# Patient Record
Sex: Female | Born: 1990 | Hispanic: Yes | Marital: Single | Smoking: Never smoker
Health system: Southern US, Community
[De-identification: ages and names within clinical notes are randomized; demographics above are authoritative.]

## PROBLEM LIST (undated history)

## (undated) DIAGNOSIS — G43909 Migraine, unspecified, not intractable, without status migrainosus: Secondary | ICD-10-CM

---
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
## 2015-05-27 ENCOUNTER — Encounter: Payer: Self-pay | Admitting: Emergency Medicine

## 2015-05-27 ENCOUNTER — Emergency Department
Admission: EM | Admit: 2015-05-27 | Discharge: 2015-05-27 | Disposition: A | Discharge status: Home / Self Care | Payer: Self-pay | Attending: Emergency Medicine | Admitting: Emergency Medicine

## 2015-05-27 DIAGNOSIS — Z3202 Encounter for pregnancy test, result negative: Secondary | ICD-10-CM | POA: Insufficient documentation

## 2015-05-27 DIAGNOSIS — G44219 Episodic tension-type headache, not intractable: Secondary | ICD-10-CM | POA: Insufficient documentation

## 2015-05-27 HISTORY — DX: Migraine, unspecified, not intractable, without status migrainosus: G43.909

## 2015-05-27 LAB — POCT PREGNANCY, URINE: PREG TEST UR: NEGATIVE

## 2015-05-27 MED ORDER — METOCLOPRAMIDE HCL 5 MG/ML IJ SOLN
10.0000 mg | Freq: Once | INTRAMUSCULAR | Status: DC
  Filled 2015-05-27: qty 2

## 2015-05-27 MED ORDER — IBUPROFEN 800 MG PO TABS: 800.0000 mg | ORAL_TABLET | Freq: Three times a day (TID) | ORAL | Status: AC | PRN

## 2015-05-27 MED ORDER — KETOROLAC TROMETHAMINE 30 MG/ML IJ SOLN
30.0000 mg | Freq: Once | INTRAMUSCULAR | Status: DC
Start: 2015-05-27 — End: 2015-05-28
  Filled 2015-05-27: qty 1

## 2015-05-27 MED ORDER — METOCLOPRAMIDE HCL 10 MG PO TABS: 10.0000 mg | ORAL_TABLET | Freq: Three times a day (TID) | ORAL | Status: AC | PRN

## 2015-05-27 MED ORDER — SODIUM CHLORIDE 0.9 % IV BOLUS (SEPSIS): 1000.0000 mL | Freq: Once | INTRAVENOUS | Status: DC

## 2015-05-27 NOTE — ED Notes (Signed)
Was going to start an IV on the pt to administer medication. Pt states she does not want the IV or any medication to be given through a needle. Asked pt if there was something specific she was wanting [place] to do and she states she wants to go home. MD aware and discharge instructions prepared. Pt and family standing in hallway waiting on paperwork.

## 2015-05-27 NOTE — Discharge Instructions (Signed)
Tension Headache A tension headache is pain, pressure, or aching that is felt over the front and sides of your head. These headaches can last from 30 minutes to several days. HOME CARE Managing Pain  Take over-the-counter and prescription medicines only as told by your doctor.  Lie down in a dark, quiet room when you have a headache.  If directed, apply ice to your head and neck area:  Put ice in a plastic bag.  Place a towel between your skin and the bag.  Leave the ice on for 20 minutes, 2-3 times per day.  Use a heating pad or a hot shower to apply heat to your head and neck area as told by your doctor. Eating and Drinking  Eat meals on a regular schedule.  Do not drink a lot of alcohol.  Do not use a lot of caffeine, or stop using caffeine. General Instructions  Keep all follow-up visits as told by your doctor. This is important.  Keep a journal to find out if certain things bring on headaches. For example, write down:  What you eat and drink.  How much sleep you get.  Any change to your diet or medicines.  Try getting a massage, or doing other things that help you to relax.  Lessen stress.  Sit up straight. Do not tighten (tense) your muscles.  Do not use tobacco products. This includes cigarettes, chewing tobacco, or e-cigarettes. If you need help quitting, ask your doctor.  Exercise regularly as told by your doctor.  Get enough sleep. This may mean 7-9 hours of sleep. GET HELP IF:  Your symptoms are not helped by medicine.  You have a headache that feels different from your usual headache.  You feel sick to your stomach (nauseous) or you throw up (vomit).  You have a fever. GET HELP RIGHT AWAY IF:  Your headache becomes very bad.  You keep throwing up.  You have a stiff neck.  You have trouble seeing.  You have trouble speaking.  You have pain in your eye or ear.  Your muscles are weak or you lose muscle control.  You lose your balance  or you have trouble walking.  You feel like you will pass out (faint) or you pass out.  You have confusion.   This information is not intended to replace advice given to you by your health care provider. Make sure you discuss any questions you have with your health care provider.   Document Released: 09/18/2009 Document Revised: 03/15/2015 Document Reviewed: 10/17/2014 Elsevier Interactive Patient Education 2016 [place] Headache Without Cause A headache is pain or discomfort felt around the head or neck area. The specific cause of a headache may not be found. There are many causes and types of headaches. A few common ones are:  Tension headaches.  Migraine headaches.  Cluster headaches.  Chronic daily headaches. HOME CARE INSTRUCTIONS  Watch your condition for any changes. Take these steps to help with your condition: Managing Pain  Take over-the-counter and prescription medicines only as told by your health care provider.  Lie down in a dark, quiet room when you have a headache.  If directed, apply ice to the head and neck area:  Put ice in a plastic bag.  Place a towel between your skin and the bag.  Leave the ice on for 20 minutes, 2-3 times per day.  Use a heating pad or hot shower to apply heat to the head and neck area as  told by your health care provider.  Keep lights dim if bright lights bother you or make your headaches worse. Eating and Drinking  Eat meals on a regular schedule.  Limit alcohol use.  Decrease the amount of caffeine you drink, or stop drinking caffeine. General Instructions  Keep all follow-up visits as told by your health care provider. This is important.  Keep a headache journal to help find out what may trigger your headaches. For example, write down:  What you eat and drink.  How much sleep you get.  Any change to your diet or medicines.  Try massage or other relaxation techniques.  Limit stress.  Sit up  straight, and do not tense your muscles.  Do not use tobacco products, including cigarettes, chewing tobacco, or e-cigarettes. If you need help quitting, ask your health care provider.  Exercise regularly as told by your health care provider.  Sleep on a regular schedule. Get 7-9 hours of sleep, or the amount recommended by your health care provider. SEEK MEDICAL CARE IF:   Your symptoms are not helped by medicine.  You have a headache that is different from the usual headache.  You have nausea or you vomit.  You have a fever. SEEK IMMEDIATE MEDICAL CARE IF:   Your headache becomes severe.  You have repeated vomiting.  You have a stiff neck.  You have a loss of vision.  You have problems with speech.  You have pain in the eye or ear.  You have muscular weakness or loss of muscle control.  You lose your balance or have trouble walking.  You feel faint or pass out.  You have confusion.   This information is not intended to replace advice given to you by your health care provider. Make sure you discuss any questions you have with your health care provider.   Document Released: 06/24/2005 Document Revised: 03/15/2015 Document Reviewed: 10/17/2014 Elsevier Interactive Patient Education [employer].

## 2015-05-27 NOTE — ED Notes (Signed)
Headache, that started this morning.  Has hx of migraines over the past year.  This migraine is strong per patient but not unlike previous migraines in the past.  Also states she has noticed a lump on the side of her head last Tuesday but denies recent injury.  I see no lump or breaks in the skin.

## 2015-05-28 NOTE — ED Provider Notes (Signed)
Time Seen: Approximately 2100  I have reviewed the triage notes  Chief Complaint: Headache   History of Present Illness: Shelby Hale is a 24 y.o. female who presents with left-sided cephalgia. Patient states that she's had the pain now for the past at 12 hours. She states she also noticed a "" a lot of "" on the right side of her head last Tuesday. She denies any trauma. Patient denies any fever, chills, persistent vomiting, photophobia etc. She states she has a history of migraines and her last headache was approximately 2 months ago. She denies any focal weakness or extremity symptoms. She denies any ataxia Past Medical History  Diagnosis Date  . Migraines     There are no active problems to display for this patient.   History reviewed. No pertinent past surgical history.  History reviewed. No pertinent past surgical history.  Current Outpatient Rx  Name  Route  Sig  Dispense  Refill  . ibuprofen (ADVIL,MOTRIN) 800 MG tablet   Oral   Take 1 tablet (800 mg total) by mouth every 8 (eight) hours as needed.   30 tablet   0   . metoCLOPramide (REGLAN) 10 MG tablet   Oral   Take 1 tablet (10 mg total) by mouth every 8 (eight) hours as needed for nausea.   30 tablet   1     Allergies:  Review of patient's allergies indicates no known allergies.  Family History: No family history on file.  Social History: Social History  Substance Use Topics  . Smoking status: Never Smoker   . Smokeless tobacco: None  . Alcohol Use: No     Review of Systems:   10 point review of systems was performed and was otherwise negative:  Constitutional: No fever Eyes: No visual disturbances ENT: No sore throat, ear pain Cardiac: No chest pain Respiratory: No shortness of breath, wheezing, or stridor Abdomen: No abdominal pain, no vomiting, No diarrhea Endocrine: No weight loss, No night sweats Extremities: No peripheral edema, cyanosis Skin: No rashes, easy bruising Neurologic: No  focal weakness, trouble with speech or swollowing Urologic: No dysuria, Hematuria, or urinary frequency   Physical Exam:  ED Triage Vitals  Enc Vitals Group     BP 05/27/15 2006 131/85 mmHg     Pulse Rate 05/27/15 2006 76     Resp 05/27/15 2006 18     Temp 05/27/15 2006 98.5 F (36.9 C)     Temp Source 05/27/15 2006 Oral     SpO2 05/27/15 2006 100 %     Weight 05/27/15 2000 150 lb (68.04 kg)     Height 05/27/15 2000 5\' 2"  (1.575 m)     Head Cir --      Peak Flow --      Pain Score 05/27/15 2000 8     Pain Loc --      Pain Edu? --      Excl. in [place]? --     General: Awake , Alert , and Oriented times 3; GCS 15 Head: Normal cephalic , atraumatic. There is no palpable masses on the right side or the patient states she felt warm. Her headache is reproducible with palpation p across the left temporal and parietal area. Eyes: Pupils equal , round, reactive to light Nose/Throat: No nasal drainage, patent upper airway without erythema or exudate.  Neck: Supple, Full range of motion, No anterior adenopathy or palpable thyroid masses Lungs: Clear to ascultation without wheezes , rhonchi, or rales  Heart: Regular rate, regular rhythm without murmurs , gallops , or rubs Abdomen: Soft, non tender without rebound, guarding , or rigidity; bowel sounds positive and symmetric in all 4 quadrants. No organomegaly .        Extremities: 2 plus symmetric pulses. No edema, clubbing or cyanosis Neurologic: normal ambulation, Motor symmetric without deficits, sensory intact Skin: warm, dry, no rashes   Labs:   All laboratory work was reviewed including any pertinent negatives or positives listed below:  Labs Reviewed  POCT PREGNANCY, URINE      ED Course: Differential diagnosis includes but is not exclusive to subarachnoid hemorrhage, meningitis, encephalitis, previous head trauma, cavernous venous thrombosis, muscle tension headache, temporal arteritis, migraine or migraine equivalent, etc. I  felt given her current clinical presentation and objective findings this most likely was a muscle tension headache the patient declined on any IV medications and will be discharged on ibuprofen and Reglan. She does have a history of migraines this headache seemed to be reproducible.    Assessment: Acute exacerbation of chronic cephalgia  Final Clinical Impression:  Final diagnoses:  Episodic tension-type headache, not intractable     Plan: * Outpatient management Patient was advised to return immediately if condition worsens. Patient was advised to follow up with her primary care physician or other specialized physicians involved and in their current assessment.            Daymon Larsen, MD 05/28/15 [phone]

## 2017-09-23 ENCOUNTER — Other Ambulatory Visit: Payer: Self-pay | Admitting: Physician Assistant

## 2017-09-23 ENCOUNTER — Other Ambulatory Visit: Payer: Self-pay | Admitting: *Deleted

## 2017-09-23 DIAGNOSIS — Z3401 Encounter for supervision of normal first pregnancy, first trimester: Secondary | ICD-10-CM

## 2017-09-23 DIAGNOSIS — Z3687 Encounter for antenatal screening for uncertain dates: Secondary | ICD-10-CM

## 2017-09-26 ENCOUNTER — Ambulatory Visit
Admission: RE | Admit: 2017-09-26 | Discharge: 2017-09-26 | Disposition: A | Discharge status: Home / Self Care | Payer: Self-pay | Source: Ambulatory Visit | Attending: Physician Assistant | Admitting: Physician Assistant

## 2017-09-26 ENCOUNTER — Encounter: Payer: Self-pay | Admitting: Radiology

## 2017-09-26 DIAGNOSIS — Z3401 Encounter for supervision of normal first pregnancy, first trimester: Secondary | ICD-10-CM | POA: Insufficient documentation

## 2017-09-26 DIAGNOSIS — Z3A09 9 weeks gestation of pregnancy: Secondary | ICD-10-CM | POA: Insufficient documentation

## 2017-09-26 DIAGNOSIS — Z3687 Encounter for antenatal screening for uncertain dates: Secondary | ICD-10-CM | POA: Insufficient documentation

## 2017-11-19 ENCOUNTER — Other Ambulatory Visit: Payer: Self-pay | Admitting: Physician Assistant

## 2017-11-19 DIAGNOSIS — Z3402 Encounter for supervision of normal first pregnancy, second trimester: Secondary | ICD-10-CM

## 2017-11-19 DIAGNOSIS — Z20821 Contact with and (suspected) exposure to Zika virus: Secondary | ICD-10-CM

## 2017-11-27 ENCOUNTER — Encounter: Payer: Self-pay | Admitting: *Deleted

## 2017-11-27 ENCOUNTER — Ambulatory Visit
Admission: RE | Admit: 2017-11-27 | Discharge: 2017-11-27 | Disposition: A | Discharge status: Home / Self Care | Payer: Medicaid Other | Source: Ambulatory Visit | Attending: Maternal & Fetal Medicine | Admitting: Maternal & Fetal Medicine

## 2017-11-27 DIAGNOSIS — Z3A18 18 weeks gestation of pregnancy: Secondary | ICD-10-CM | POA: Insufficient documentation

## 2017-11-27 DIAGNOSIS — Z20821 Contact with and (suspected) exposure to Zika virus: Secondary | ICD-10-CM | POA: Diagnosis present

## 2017-11-27 DIAGNOSIS — Z3402 Encounter for supervision of normal first pregnancy, second trimester: Secondary | ICD-10-CM

## 2018-07-20 ENCOUNTER — Encounter (HOSPITAL_COMMUNITY): Payer: Self-pay

## 2018-08-28 ENCOUNTER — Other Ambulatory Visit: Payer: Self-pay | Admitting: Physician Assistant

## 2018-08-28 DIAGNOSIS — Z3687 Encounter for antenatal screening for uncertain dates: Secondary | ICD-10-CM

## 2018-09-02 ENCOUNTER — Ambulatory Visit
Admission: RE | Admit: 2018-09-02 | Discharge: 2018-09-02 | Disposition: A | Discharge status: Home / Self Care | Payer: Medicaid Other | Source: Ambulatory Visit | Attending: Physician Assistant | Admitting: Physician Assistant

## 2018-09-02 DIAGNOSIS — Z3687 Encounter for antenatal screening for uncertain dates: Secondary | ICD-10-CM | POA: Diagnosis present

## 2018-09-11 IMAGING — US US OB < 14 WEEKS - US OB TV
1 series · 14 of 28 positions shown · non-contrast
Comparison: None.

CLINICAL DATA: Antenatal screening, uncertain dates

EXAM:
OBSTETRIC <14 WK US AND TRANSVAGINAL OB US
TECHNIQUE: Both transabdominal and transvaginal ultrasound examinations were
performed for complete evaluation of the gestation as well as the
maternal uterus, adnexal regions, and pelvic cul-de-sac.
Transvaginal technique was performed to assess early pregnancy.

[Series 1: us ob < 14 weeks - us ob tv · 0.16mm/px · 14 of 235 slices shown]
[im 9/235]
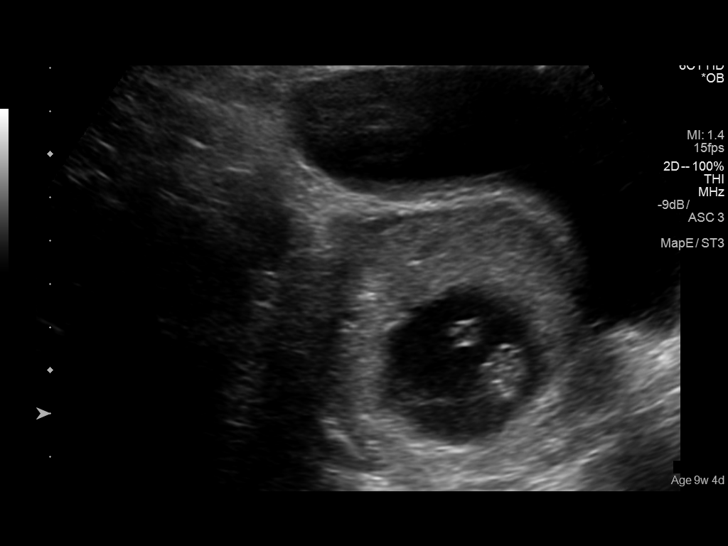
[im 27/235]
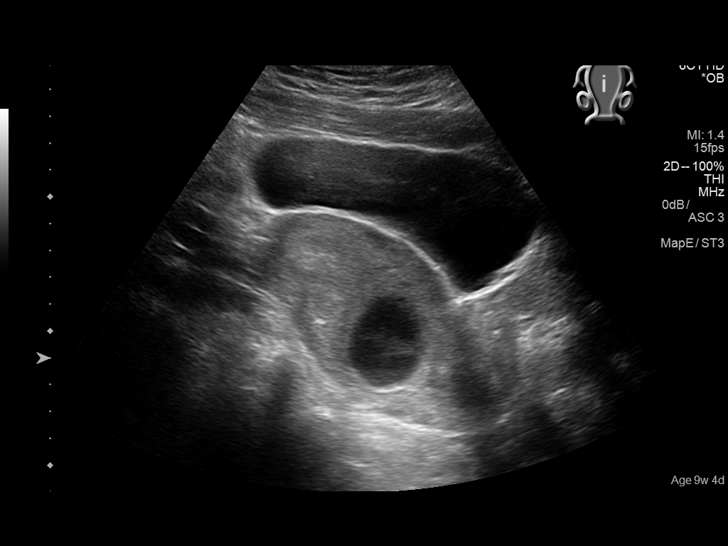
[im 44/235]
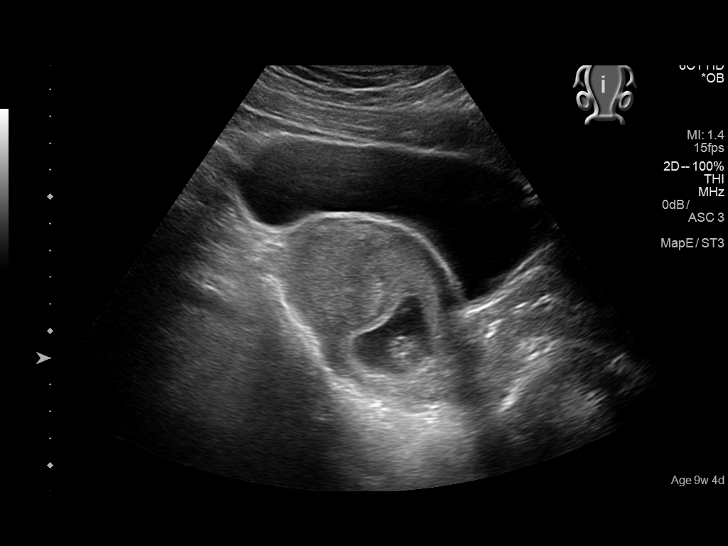
[im 61/235]
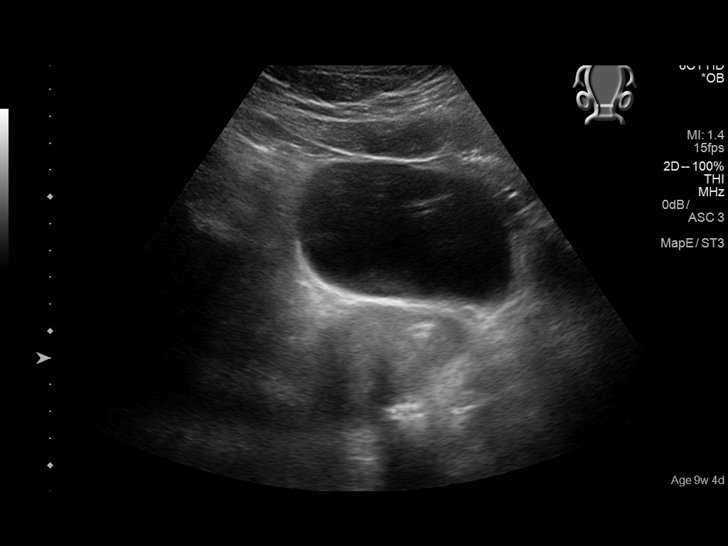
[im 79/235]
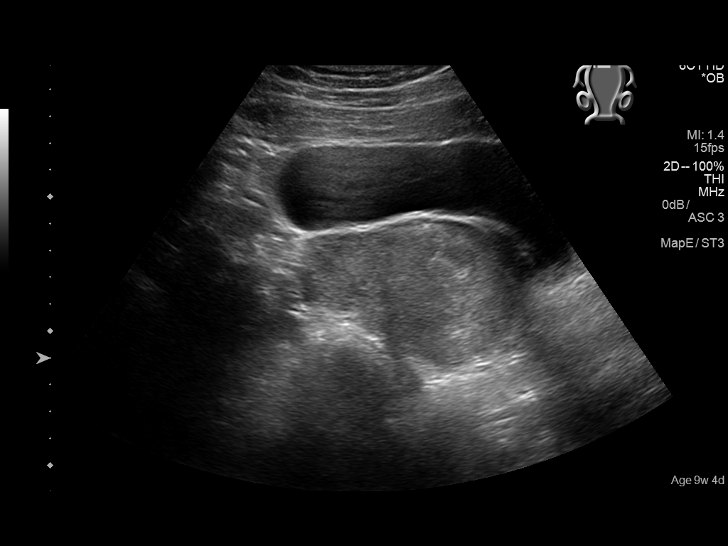
[im 96/235]
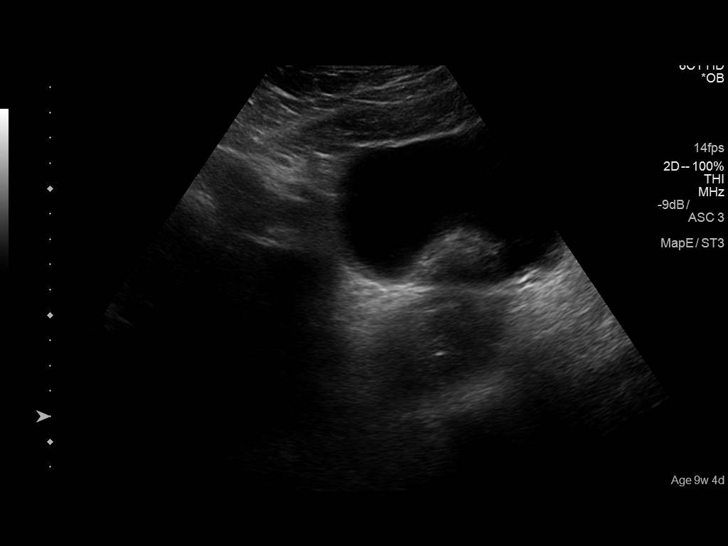
[im 113/235]
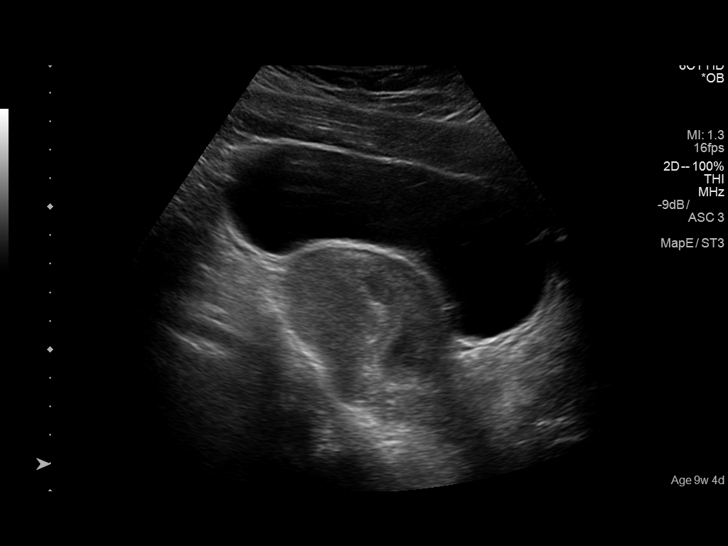
[im 131/235]
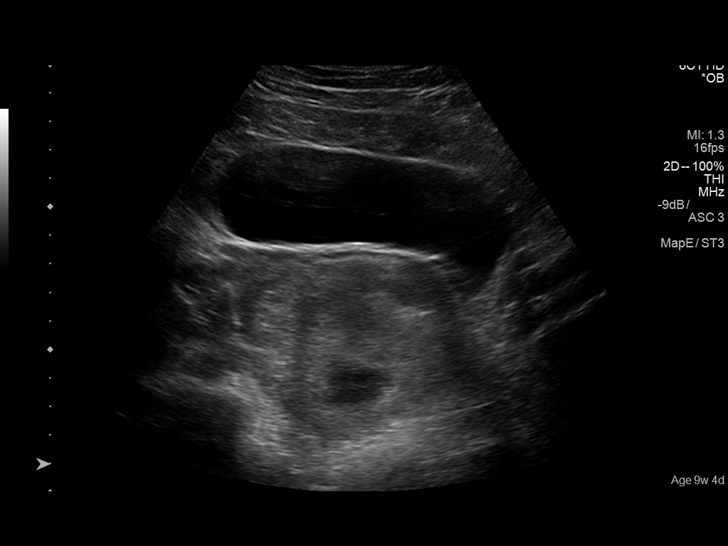
[im 148/235]
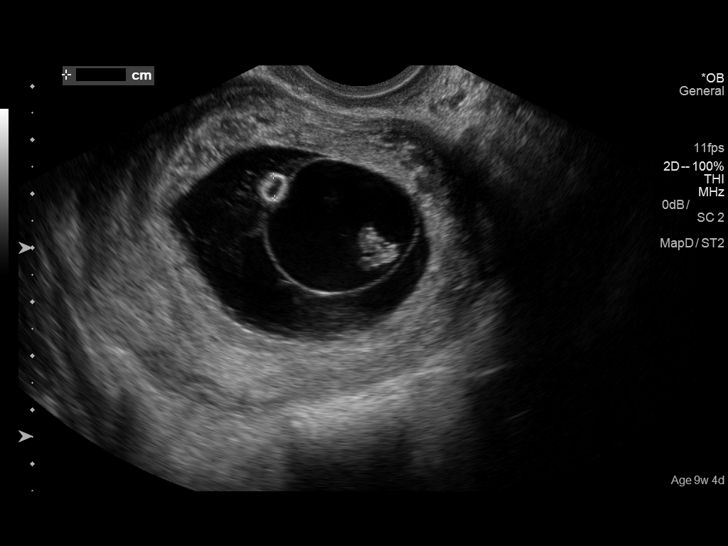
[im 165/235]
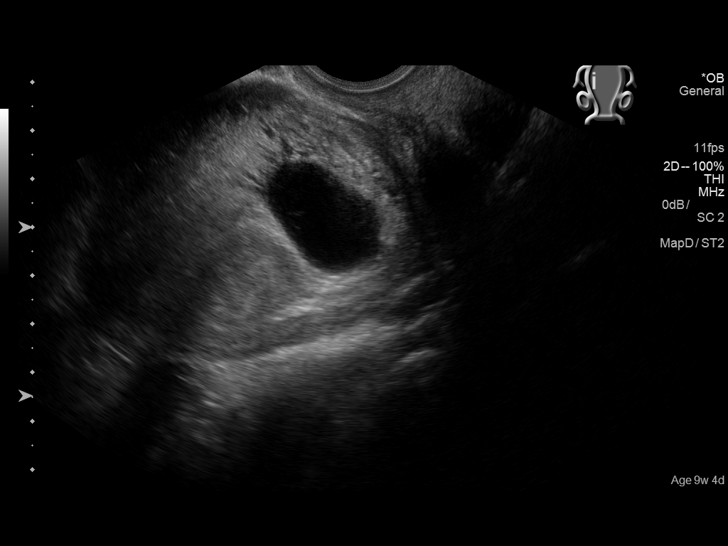
[im 183/235]
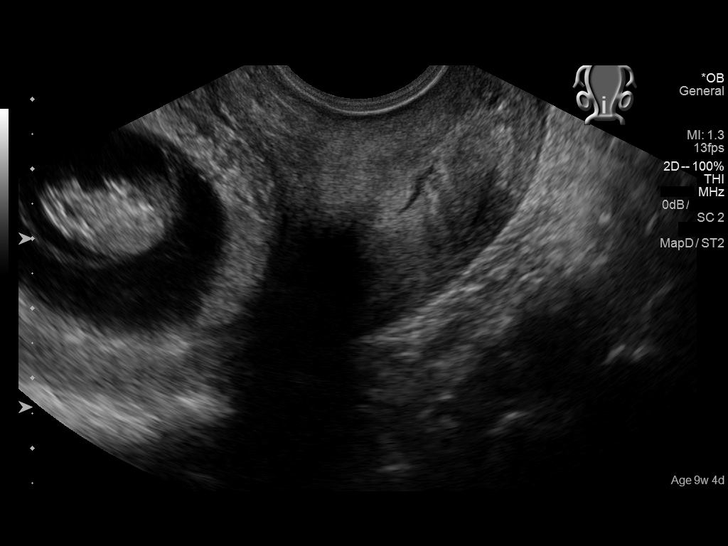
[im 200/235]
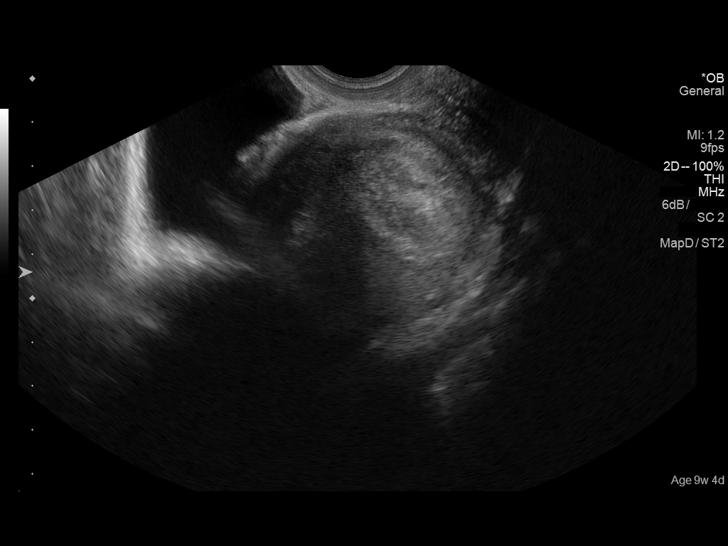
[im 217/235]
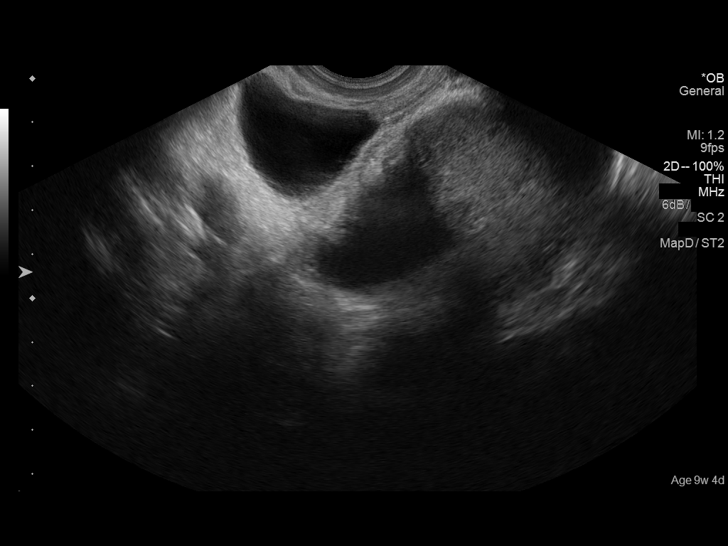
[im 235/235]
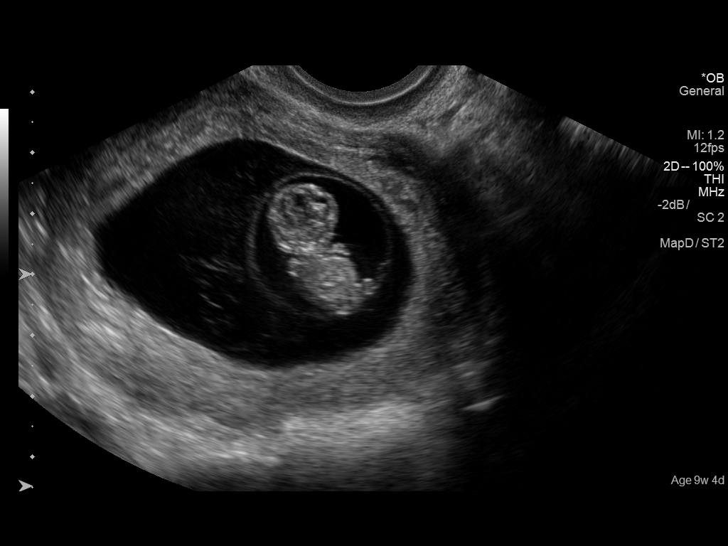

[14 of 28 positions shown; findings below may reference images not displayed]

FINDINGS: Intrauterine gestational sac: Visible

Yolk sac:  Visible

Embryo:  Visible

Cardiac Activity: Visible

Heart Rate: 162 bpm

CRL:  24.2 mm   9 w   1 d                  US EDC: 04/30/2018

Subchorionic hemorrhage:  None visualized.

Maternal uterus/adnexae: Right ovary is not visualized. Left ovary
within normal limits and measures 3.4 x 1.8 x 2.3 cm. No significant
free fluid.
IMPRESSION: Single viable intrauterine pregnancy as above. Nonvisualized right
ovary.

## 2020-08-18 ENCOUNTER — Other Ambulatory Visit: Payer: Self-pay

## 2020-08-18 ENCOUNTER — Encounter: Payer: Self-pay | Admitting: Emergency Medicine

## 2020-08-18 ENCOUNTER — Emergency Department
Admission: EM | Admit: 2020-08-18 | Discharge: 2020-08-18 | Disposition: A | Discharge status: Home / Self Care | Payer: Medicaid Other | Attending: Emergency Medicine | Admitting: Emergency Medicine

## 2020-08-18 DIAGNOSIS — L500 Allergic urticaria: Secondary | ICD-10-CM | POA: Insufficient documentation

## 2020-08-18 DIAGNOSIS — L509 Urticaria, unspecified: Secondary | ICD-10-CM | POA: Diagnosis present

## 2020-08-18 DIAGNOSIS — T7840XA Allergy, unspecified, initial encounter: Secondary | ICD-10-CM | POA: Diagnosis not present

## 2020-08-18 DIAGNOSIS — R0789 Other chest pain: Secondary | ICD-10-CM | POA: Insufficient documentation

## 2020-08-18 DIAGNOSIS — R1310 Dysphagia, unspecified: Secondary | ICD-10-CM | POA: Insufficient documentation

## 2020-08-18 MED ORDER — PREDNISONE 20 MG PO TABS
60.0000 mg | ORAL_TABLET | Freq: Once | ORAL | Status: AC
  Administered 2020-08-18: 60 mg via ORAL
  Filled 2020-08-18 (×2): qty 3

## 2020-08-18 MED ORDER — FAMOTIDINE IN NACL 20-0.9 MG/50ML-% IV SOLN
20.0000 mg | Freq: Once | INTRAVENOUS | Status: AC
Start: 2020-08-18 — End: 2020-08-18
  Administered 2020-08-18: 20 mg via INTRAVENOUS
  Filled 2020-08-18: qty 50

## 2020-08-18 MED ORDER — EPINEPHRINE 0.3 MG/0.3ML IJ SOAJ: 0.3000 mg | INTRAMUSCULAR | 1 refills | Status: AC | PRN

## 2020-08-18 MED ORDER — EPINEPHRINE 0.3 MG/0.3ML IJ SOAJ
0.3000 mg | Freq: Once | INTRAMUSCULAR | Status: AC
  Administered 2020-08-18: 0.3 mg via INTRAMUSCULAR
  Filled 2020-08-18: qty 0.3

## 2020-08-18 MED ORDER — PREDNISONE 20 MG PO TABS: 40.0000 mg | ORAL_TABLET | Freq: Every day | ORAL | 0 refills | Status: AC

## 2020-08-18 MED ORDER — DIPHENHYDRAMINE HCL 50 MG/ML IJ SOLN
12.5000 mg | Freq: Once | INTRAMUSCULAR | Status: AC
  Administered 2020-08-18: 12.5 mg via INTRAVENOUS
  Filled 2020-08-18: qty 1

## 2020-08-18 NOTE — ED Triage Notes (Signed)
Pt to ED via POV with c/o itching, pt states itching started last night to L elbow. Pt states progressed to arms and palms. Pt states ate peanuts last night with no known reaction to peanuts. Pt able to speak in full and complete sentences at this time.  Pt states awoke this morning and her eyes were swollen, pt with mild rash noted to bilat palms. Pt states feels like it is difficult to swallow, pt able to maintain her own secretions without difficulty. Pt states hx of this kind of reaction to pecans.   Pt states OTC allergy relief PTA.

## 2020-08-18 NOTE — ED Provider Notes (Signed)
[facility] Emergency Department Provider Note    Event Date/Time   First MD Initiated Contact with Patient 08/18/20 1506     (approximate)  I have reviewed the triage vital signs and the nursing notes.   HISTORY  Chief Complaint Allergic reaction  HPI Shelby Hale is a 30 y.o. female with no significant past medical history presents to the ER for evaluation of diffuse spreading urticarial rash associated with feelings of chest tightness and trouble swallowing.  States that she ate some peanuts last night.  Shortly thereafter she started developing the symptoms.  Is not had anaphylactic reaction before but has had urticaria and rash related to eating pecans previously.  No new deodorants or perfumes.   Past Medical History:  Diagnosis Date  . Migraines    History reviewed. No pertinent family history. History reviewed. No pertinent surgical history. There are no problems to display for this patient.     Prior to Admission medications   Medication Sig Start Date End Date Taking? Authorizing Provider  predniSONE (DELTASONE) 20 MG tablet Take 2 tablets (40 mg total) by mouth daily for 4 days. 08/18/20 08/22/20 Yes Merlyn Lot, MD  ibuprofen (ADVIL,MOTRIN) 800 MG tablet Take 1 tablet (800 mg total) by mouth every 8 (eight) hours as needed. Patient not taking: Reported on 11/27/2017 05/27/15   Daymon Larsen, MD  metoCLOPramide (REGLAN) 10 MG tablet Take 1 tablet (10 mg total) by mouth every 8 (eight) hours as needed for nausea. 05/27/15 05/26/16  Daymon Larsen, MD  Prenatal Vit-Fe Fumarate-FA (PRENATAL MULTIVITAMIN) TABS tablet Take 1 tablet by mouth daily at 12 noon.    [provider]    Allergies Patient has no known allergies.    Social History Social History   Tobacco Use  . Smoking status: Never Smoker  . Smokeless tobacco: Never Used  Substance Use Topics  . Alcohol use: No  . Drug use: No    Review of  Systems Patient denies headaches, rhinorrhea, blurry vision, numbness, shortness of breath, chest pain, edema, cough, abdominal pain, nausea, vomiting, diarrhea, dysuria, fevers, rashes or hallucinations unless otherwise stated above in HPI. ____________________________________________   PHYSICAL EXAM:  VITAL SIGNS: Vitals:   08/18/20 1454 08/18/20 1611  BP: 129/81 117/71  Pulse: 88 81  Resp: 20 18  Temp: 98.1 F (36.7 C)   SpO2: 97% 97%    Constitutional: Alert and oriented.  Eyes: Conjunctivae are normal.  Head: Atraumatic. Nose: No congestion/rhinnorhea. Mouth/Throat: Mucous membranes are moist.   Neck: No stridor. Painless ROM.  Cardiovascular: Normal rate, regular rhythm. Grossly normal heart sounds.  Good peripheral circulation. Respiratory: Normal respiratory effort.  No retractions. Lungs CTAB. Gastrointestinal: Soft and nontender. No distention. No abdominal bruits. No CVA tenderness. Genitourinary:  Musculoskeletal: No lower extremity tenderness nor edema.  No joint effusions. Neurologic:  Normal speech and language. No gross focal neurologic deficits are appreciated. No facial droop Skin:  Skin is warm, dry and intact. Diffuse urticarial erruption to face, neck, extremities Psychiatric: Mood and affect are normal. Speech and behavior are normal.  ____________________________________________   LABS (all labs ordered are listed, but only abnormal results are displayed)  No results found for this or any previous visit (from the past 24 hour(s)). ____________________________________________ ____________________________________________  [id number]  I personally reviewed all radiographic images ordered to evaluate for the above acute complaints and reviewed radiology reports and findings.  These findings were personally discussed with the patient.  Please see medical record  for radiology  report.  ____________________________________________   PROCEDURES  Procedure(s) performed:  Procedures    Critical Care performed: no ____________________________________________   INITIAL IMPRESSION / ASSESSMENT AND PLAN / ED COURSE  Pertinent labs & imaging results that were available during my care of the patient were reviewed by me and considered in my medical decision making (see chart for details).   DDX: Anaphylaxis, anaphylactoid reaction, urticaria, food allergy  Shelby Hale is a 30 y.o. who presents to the ED with presentation as described above.  Patient nontoxic-appearing but has urticarial irruption and feel like she is having trouble swallowing.  Will give epinephrine.  Will give steroid H1 and H2 observe and reassess.  Clinical Course as of 08/18/20 1758  Fri Aug 18, 2020  1754 Patient reassessed.  Feels well and in no acute distress.  Urticaria has significantly resolved.  Patient stable and appropriate for discharge home [PR]    Clinical Course User Index [PR] Merlyn Lot, MD    The patient was evaluated in Emergency Department today for the symptoms described in the history of present illness. He/she was evaluated in the context of the global COVID-19 pandemic, which necessitated consideration that the patient might be at risk for infection with the SARS-CoV-2 virus that causes COVID-19. Institutional protocols and algorithms that pertain to the evaluation of patients at risk for COVID-19 are in a state of rapid change based on information released by regulatory bodies including the CDC and federal and state organizations. These policies and algorithms were followed during the patient's care in the ED.  As part of my medical decision making, I reviewed the following data within the [place] notes reviewed and incorporated, Labs reviewed, notes from prior ED visits and [place] Controlled Substance  Database   ____________________________________________   FINAL CLINICAL IMPRESSION(S) / ED DIAGNOSES  Final diagnoses:  Allergic reaction, initial encounter      NEW MEDICATIONS STARTED DURING THIS VISIT:  New Prescriptions   PREDNISONE (DELTASONE) 20 MG TABLET    Take 2 tablets (40 mg total) by mouth daily for 4 days.     Note:  This document was prepared using Dragon voice recognition software and may include unintentional dictation errors.    Merlyn Lot, MD 08/18/20 1758

## 2020-08-18 NOTE — ED Notes (Signed)
Patient resting comfortably at this time with no signs of distress. Patient states that she "feels better" than PTA.

## 2020-10-11 ENCOUNTER — Encounter: Payer: Self-pay | Admitting: Dermatology

## 2020-10-11 ENCOUNTER — Other Ambulatory Visit: Payer: Self-pay

## 2020-10-11 ENCOUNTER — Ambulatory Visit: Payer: Medicaid Other | Admitting: Dermatology

## 2020-10-11 DIAGNOSIS — D229 Melanocytic nevi, unspecified: Secondary | ICD-10-CM

## 2020-10-11 DIAGNOSIS — D224 Melanocytic nevi of scalp and neck: Secondary | ICD-10-CM | POA: Diagnosis not present

## 2020-10-11 NOTE — Patient Instructions (Signed)

## 2020-10-11 NOTE — Progress Notes (Signed)
   Follow-Up Visit   Subjective  Shelby Hale is a 30 y.o. female who presents for the following: Birth mark (R post scalp, ~89yrs, has growing past 4 years, itchy, irritating).  New patient referral from [facility].  The following portions of the chart were reviewed this encounter and updated as appropriate:   Tobacco  Allergies  Meds  Problems  Med Hx  Surg Hx  Fam Hx     Review of Systems:  No other skin or systemic complaints except as noted in HPI or Assessment and Plan.  Objective  Well appearing patient in no apparent distress; mood and affect are within normal limits.  A focused examination was performed including scalp. Relevant physical exam findings are noted in the Assessment and Plan.  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  Discussed risk of recurrence with shave removal.    Return for 1-2 wks for shave removal.  I, Othelia Pulling, RMA, am acting as scribe for Sarina Ser, MD .  Documentation: I have reviewed the above documentation for accuracy and completeness, and I agree with the above.  Sarina Ser, MD

## 2020-10-18 ENCOUNTER — Other Ambulatory Visit: Payer: Self-pay | Admitting: Dermatology

## 2020-10-18 ENCOUNTER — Ambulatory Visit: Payer: Medicaid Other | Admitting: Dermatology

## 2020-10-18 ENCOUNTER — Encounter: Payer: Self-pay | Admitting: Dermatology

## 2020-10-18 ENCOUNTER — Other Ambulatory Visit: Payer: Self-pay

## 2020-10-18 DIAGNOSIS — D224 Melanocytic nevi of scalp and neck: Secondary | ICD-10-CM

## 2020-10-18 DIAGNOSIS — D492 Neoplasm of unspecified behavior of bone, soft tissue, and skin: Secondary | ICD-10-CM

## 2020-10-18 NOTE — Progress Notes (Signed)
   Follow-Up Visit   Subjective  Shelby Hale is a 30 y.o. female who presents for the following: Follow-up (Pt here to have mole removed on the Right scalp).  Husband with pt   The following portions of the chart were reviewed this encounter and updated as appropriate:   Tobacco  Allergies  Meds  Problems  Med Hx  Surg Hx  Fam Hx     Review of Systems:  No other skin or systemic complaints except as noted in HPI or Assessment and Plan.  Objective  Well appearing patient in no apparent distress; mood and affect are within normal limits.  A focused examination was performed including face,scalp . Relevant physical exam findings are noted in the Assessment and Plan.  Objective  R post auricular scalp: 1.5cm mamillated plaque   Assessment & Plan  Neoplasm of skin R post auricular scalp  Epidermal / dermal shaving  Lesion diameter (cm):  1.5 Informed consent: discussed and consent obtained   Timeout: patient name, date of birth, surgical site, and procedure verified   Procedure prep:  Patient was prepped and draped in usual sterile fashion Prep type:  Isopropyl alcohol Anesthesia: the lesion was anesthetized in a standard fashion   Anesthetic:  1% lidocaine w/ epinephrine 1-100,000 buffered w/ 8.4% NaHCO3 Hemostasis achieved with: pressure, aluminum chloride and electrodesiccation   Outcome: patient tolerated procedure well   Post-procedure details: sterile dressing applied and wound care instructions given   Dressing type: bandage and petrolatum    Specimen 1 - Surgical pathology Differential Diagnosis: R/O Nevus sebaceous of [place]ad[place]ohn vs other birthmark nevus  Check Margins: No 1.5cm mamillated plaque  Return if symptoms worsen or fail to improve.  IMarye Round, CMA, am acting as scribe for Sarina Ser, MD .  Documentation: I have reviewed the above documentation for accuracy and completeness, and I agree with the above.  Sarina Ser, MD

## 2020-10-18 NOTE — Patient Instructions (Signed)

## 2020-10-23 ENCOUNTER — Telehealth: Payer: Self-pay

## 2020-10-23 NOTE — Telephone Encounter (Signed)
-----   Message from Ralene Bathe, MD sent at 10/19/2020  6:18 PM EDT ----- Diagnosis Skin , right posterior auricular scalp MELANOCYTIC NEVUS, COMPOUND CONGENITAL TYPE  Benign birth mark type mole No further treatment needed

## 2020-10-23 NOTE — Telephone Encounter (Signed)
Patient informed of pathology results. She wanted to know if it would be OK to dye her hair, and I advised her to wait another week or two to give the biopsy site time to heal.

## 2020-11-30 ENCOUNTER — Ambulatory Visit (INDEPENDENT_AMBULATORY_CARE_PROVIDER_SITE_OTHER): Payer: Medicaid Other | Admitting: Dermatology

## 2020-11-30 ENCOUNTER — Other Ambulatory Visit: Payer: Self-pay

## 2020-11-30 DIAGNOSIS — D492 Neoplasm of unspecified behavior of bone, soft tissue, and skin: Secondary | ICD-10-CM

## 2020-11-30 DIAGNOSIS — L928 Other granulomatous disorders of the skin and subcutaneous tissue: Secondary | ICD-10-CM

## 2020-11-30 MED ORDER — MUPIROCIN 2 % EX OINT: TOPICAL_OINTMENT | CUTANEOUS | 0 refills | Status: AC

## 2020-11-30 NOTE — Patient Instructions (Addendum)

## 2020-11-30 NOTE — Progress Notes (Signed)
   Follow-Up Visit   Subjective  Shelby Hale is a 30 y.o. female who presents for the following: Follow-up (Recheck biopsy site on the right post auricular scalp, biopsy proven Congential nevus, pt report area not healing well ).  The following portions of the chart were reviewed this encounter and updated as appropriate:   Tobacco  Allergies  Meds  Problems  Med Hx  Surg Hx  Fam Hx     Review of Systems:  No other skin or systemic complaints except as noted in HPI or Assessment and Plan.  Objective  Well appearing patient in no apparent distress; mood and affect are within normal limits.  A focused examination was performed including face, scalp . Relevant physical exam findings are noted in the Assessment and Plan.  Objective  right post auricular scalp: /ulcer with cellulitis    Assessment & Plan  Neoplasm of skin -residual congenital nevus versus granulation tissue versus other right post auricular scalp  Epidermal / dermal shaving  Lesion diameter (cm):  0.8 Informed consent: discussed and consent obtained   Patient was prepped and draped in usual sterile fashion: area prepped with alcohol. Anesthesia: the lesion was anesthetized in a standard fashion   Anesthetic:  1% lidocaine w/ epinephrine 1-100,000 buffered w/ 8.4% NaHCO3 Instrument used: flexible razor blade   Hemostasis achieved with: pressure, aluminum chloride and electrodesiccation   Outcome: patient tolerated procedure well   Post-procedure details: wound care instructions given   Post-procedure details comment:  Ointment and small bandage applied  Specimen 1 - Surgical pathology Differential Diagnosis: R/O  Recurrent Congential nevus   Check Margins: No /ulcer with cellulitis  Granulation tissue  Cleansed with Puracyn, Debridement, Mupirocin ointment applied  Start Mupirocin ointment apply to scalp daily  Return in about 4 weeks (around 12/28/2020) for recheck .  IMarye Round, CMA,  am acting as scribe for Sarina Ser, MD .  Documentation: I have reviewed the above documentation for accuracy and completeness, and I agree with the above.  Sarina Ser, MD

## 2020-12-04 ENCOUNTER — Encounter: Payer: Self-pay | Admitting: Dermatology

## 2020-12-11 ENCOUNTER — Telehealth: Payer: Self-pay

## 2020-12-11 NOTE — Telephone Encounter (Signed)
Patient informed of pathology results 

## 2020-12-11 NOTE — Telephone Encounter (Signed)
-----   Message from Ralene Bathe, MD sent at 12/08/2020  6:02 PM EDT ----- Diagnosis Skin , right post auricular scalp INFLAMED GRANULATION TISSUE, SEE DESCRIPTION  Benign inflamed "healing tissue" Wound should heal now since this removed. This can happen to any wound, but more common on scalp. Continue wound care and return if further problems.

## 2020-12-28 ENCOUNTER — Ambulatory Visit: Payer: Medicaid Other | Admitting: Dermatology

## 2021-02-20 ENCOUNTER — Encounter: Payer: Self-pay | Admitting: Emergency Medicine

## 2021-02-20 ENCOUNTER — Other Ambulatory Visit: Payer: Self-pay

## 2021-02-20 ENCOUNTER — Emergency Department
Admission: EM | Admit: 2021-02-20 | Discharge: 2021-02-20 | Disposition: A | Discharge status: Home / Self Care | Payer: Medicaid Other | Attending: Emergency Medicine | Admitting: Emergency Medicine

## 2021-02-20 DIAGNOSIS — R102 Pelvic and perineal pain: Secondary | ICD-10-CM | POA: Insufficient documentation

## 2021-02-20 DIAGNOSIS — Z5321 Procedure and treatment not carried out due to patient leaving prior to being seen by health care provider: Secondary | ICD-10-CM | POA: Diagnosis not present

## 2021-02-20 NOTE — ED Notes (Signed)
Pt called to a room, no response. Pt cell phone called and pt reported she left and would follow up elsewhere

## 2021-02-20 NOTE — ED Triage Notes (Signed)
Pt reports pelvic pain intermittently for several months. Pt reports went to [facility] and was told to follow up with a specialist for a definitive diagnosis but she was told the ER could get her answers faster. Pt admits to being sexually active and not using protection but states she has had a vaginal infection before and she does not have the same sx's.

## 2021-02-21 ENCOUNTER — Encounter: Payer: Self-pay | Admitting: Emergency Medicine

## 2021-02-21 ENCOUNTER — Emergency Department: Payer: Medicaid Other

## 2021-02-21 ENCOUNTER — Emergency Department
Admission: EM | Admit: 2021-02-21 | Discharge: 2021-02-21 | Disposition: A | Discharge status: Home / Self Care | Payer: Medicaid Other | Attending: Emergency Medicine | Admitting: Emergency Medicine

## 2021-02-21 ENCOUNTER — Other Ambulatory Visit: Payer: Self-pay

## 2021-02-21 DIAGNOSIS — R1032 Left lower quadrant pain: Secondary | ICD-10-CM | POA: Insufficient documentation

## 2021-02-21 LAB — COMPREHENSIVE METABOLIC PANEL
ALT: 70 U/L — ABNORMAL HIGH (ref 0–44)
AST: 46 U/L — ABNORMAL HIGH (ref 15–41)
Albumin: 3.9 g/dL (ref 3.5–5.0)
Alkaline Phosphatase: 74 U/L (ref 38–126)
Anion gap: 10 (ref 5–15)
BUN: 11 mg/dL (ref 6–20)
CO2: 25 mmol/L (ref 22–32)
Calcium: 9 mg/dL (ref 8.9–10.3)
Chloride: 102 mmol/L (ref 98–111)
Creatinine, Ser: 0.6 mg/dL (ref 0.44–1.00)
GFR, Estimated: >60 mL/min (ref >60)
Glucose, Bld: 94 mg/dL (ref 70–99)
Potassium: 3.9 mmol/L (ref 3.5–5.1)
Sodium: 137 mmol/L (ref 135–145)
Total Bilirubin: 1.2 mg/dL (ref 0.3–1.2)
Total Protein: 7.2 g/dL (ref 6.5–8.1)

## 2021-02-21 LAB — URINALYSIS, COMPLETE (UACMP) WITH MICROSCOPIC
Bilirubin Urine: NEGATIVE
Glucose, UA: NEGATIVE mg/dL
Hgb urine dipstick: NEGATIVE
Ketones, ur: NEGATIVE mg/dL
Nitrite: NEGATIVE
Protein, ur: NEGATIVE mg/dL
Specific Gravity, Urine: 1.021 (ref 1.005–1.030)
pH: 7 (ref 5.0–8.0)

## 2021-02-21 LAB — LIPASE, BLOOD: Lipase: 30 U/L (ref 11–51)

## 2021-02-21 LAB — CBC
HCT: 36 % (ref 36.0–46.0)
Hemoglobin: 12.7 g/dL (ref 12.0–15.0)
MCH: 30.2 pg (ref 26.0–34.0)
MCHC: 35.3 g/dL (ref 30.0–36.0)
MCV: 85.5 fL (ref 80.0–100.0)
Platelets: 356 10*3/uL (ref 150–400)
RBC: 4.21 MIL/uL (ref 3.87–5.11)
RDW: 13 % (ref 11.5–15.5)
WBC: 5.6 10*3/uL (ref 4.0–10.5)
nRBC: 0 % (ref 0.0–0.2)

## 2021-02-21 LAB — POC URINE PREG, ED: Preg Test, Ur: NEGATIVE

## 2021-02-21 MED ORDER — IBUPROFEN 800 MG PO TABS
800.0000 mg | ORAL_TABLET | ORAL | Status: AC
  Administered 2021-02-21: 800 mg via ORAL
  Filled 2021-02-21: qty 1

## 2021-02-21 NOTE — ED Provider Notes (Signed)
[facility] Emergency Department Provider Note ____________________________________________   Event Date/Time   First MD Initiated Contact with Patient 02/21/21 1233     (approximate)  I have reviewed the triage vital signs and the nursing notes.   HISTORY  Chief Complaint Abdominal Pain   HPI Shelby Hale is a 30 y.o. female reports a history of migraines, 2 previous pregnancies denies pregnancy today  For about 2 months now has been noticing a moderate pain located in her left lower pelvis.  She reports that its fairly consistent been present almost daily now for 2 months.  Its not particularly worsening but does not seem to improve.  At times it radiates slightly towards her right lower pelvic region as well.  She reports she went to [facility] about a month ago and had pelvic exam STD check there and was placed on metronidazole for 2 weeks for a vaginitis.  Her symptoms did not really improve, and then she reports she called them back and they placed her on 2 days of fluconazole which she took and that did not seem to improve her symptoms either  Denies any vaginal discharge discomfort or bleeding.  Her menstrual cycles have been normal.  She denies pregnancy.  No abdominal pain she reports the pain seems to be located in her left lower pelvic region.  It does not radiate to the back does not radiate down the leg no nausea vomiting fevers chills or other symptoms  Patient tells me that she would like to get an ultrasound to see if it might be related to an ovary.  Also, at this point we discussed potentially obtaining another check for infection STDs and a pelvic exam but patient reports that she just had that done and was just treated for vaginitis and does not wish to have an additional pelvic exam done.  She reports its not really vaginal discharge or discomfort but rather it is more of a pain in her left lower pelvis and she would really like to  get an ultrasound make sure is not her ovary   Past Medical History:  Diagnosis Date   Migraines     There are no problems to display for this patient.   History reviewed. No pertinent surgical history.  Prior to Admission medications   Medication Sig Start Date End Date Taking? Authorizing Provider  EPINEPHrine (EPIPEN 2-PAK) 0.3 mg/0.3 mL IJ SOAJ injection Inject 0.3 mg into the muscle as needed for anaphylaxis. 08/18/20   Merlyn Lot, MD  ibuprofen (ADVIL,MOTRIN) 800 MG tablet Take 1 tablet (800 mg total) by mouth every 8 (eight) hours as needed. Patient not taking: Reported on 11/27/2017 05/27/15   Daymon Larsen, MD  metoCLOPramide (REGLAN) 10 MG tablet Take 1 tablet (10 mg total) by mouth every 8 (eight) hours as needed for nausea. 05/27/15 05/26/16  Daymon Larsen, MD  mupirocin ointment Drue Stager) 2 % Apply to skin qd-bid 11/30/20   Ralene Bathe, MD  Prenatal Vit-Fe Fumarate-FA (PRENATAL MULTIVITAMIN) TABS tablet Take 1 tablet by mouth daily at 12 noon. Patient not taking: Reported on 10/11/2020    [provider]    Allergies Amoxicillin  History reviewed. No pertinent family history.  Social History Social History   Tobacco Use   Smoking status: Never   Smokeless tobacco: Never  Substance Use Topics   Alcohol use: No   Drug use: No    Review of Systems Constitutional: No fever/chills Eyes: No visual  changes. Cardiovascular: Denies chest pain. Respiratory: Denies shortness of breath. Gastrointestinal: No abdominal pain.  No diarrhea.  No vomiting. Genitourinary: Negative for dysuria.  No abnormal urine odor or discomfort with urination. Musculoskeletal: Negative for back pain. Skin: Negative for rash. Neurological: Negative for headaches, areas of focal weakness or numbness.    ____________________________________________   PHYSICAL EXAM:  VITAL SIGNS: ED Triage Vitals  Enc Vitals Group     BP 02/21/21 1048 113/82     Pulse  Rate 02/21/21 1048 80     Resp 02/21/21 1048 18     Temp 02/21/21 1048 98.2 F (36.8 C)     Temp Source 02/21/21 1048 Oral     SpO2 02/21/21 1048 97 %     Weight 02/21/21 1058 155 lb 13.8 oz (70.7 kg)     Height 02/21/21 1058 '5\' 2"'$  (1.575 m)     Head Circumference --      Peak Flow --      Pain Score 02/21/21 1058 6     Pain Loc --      Pain Edu? --      Excl. in [place]? --     Constitutional: Alert and oriented. Well appearing and in no acute distress. Eyes: Conjunctivae are normal. Head: Atraumatic. Nose: No congestion/rhinnorhea. Neck: No stridor.  Cardiovascular: Normal rate, regular rhythm. Good peripheral circulation. Respiratory: Normal respiratory effort.  No retractions.  Gastrointestinal: Soft and nontender. No distention. Musculoskeletal: No lower extremity tenderness nor edema. Neurologic:  Normal speech and language. No gross focal neurologic deficits are appreciated.  Skin:  Skin is warm, dry and intact. No rash noted. Psychiatric: Mood and affect are normal. Speech and behavior are normal.  ____________________________________________   LABS (all labs ordered are listed, but only abnormal results are displayed)  Labs Reviewed  COMPREHENSIVE METABOLIC PANEL - Abnormal; Notable for the following components:      Result Value   AST 46 (*)    ALT 70 (*)    All other components within normal limits  URINALYSIS, COMPLETE (UACMP) WITH MICROSCOPIC - Abnormal; Notable for the following components:   Color, Urine YELLOW (*)    APPearance CLOUDY (*)    Leukocytes,Ua TRACE (*)    Bacteria, UA MANY (*)    All other components within normal limits  URINE CULTURE  LIPASE, BLOOD  CBC  POC URINE PREG, ED   Reviewed lab work, urine with some squamous cells and patient lacking urinary symptoms.  Sent for culture would treat if positive.  Additionally her LFTs show mild very mild transaminitis but no associated right upper quadrant abdominal pain or associated GI symptoms.   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  ____________________________________________  EKG   ____________________________________________  RADIOLOGY  US PELVIC COMPLETE W TRANSVAGINAL AND TORSION R/O  Result Date: 02/21/2021 CLINICAL DATA:  LEFT lower quadrant pain for 2 months, dyspareunia, history Caesarean section x2. LMP 02/13/2021 EXAM: TRANSABDOMINAL AND TRANSVAGINAL ULTRASOUND OF PELVIS DOPPLER ULTRASOUND OF OVARIES TECHNIQUE: Both transabdominal and transvaginal ultrasound examinations of the pelvis were performed. Transabdominal technique was performed for global imaging of the pelvis including uterus, ovaries, adnexal regions, and pelvic cul-de-sac. It was necessary to proceed with endovaginal exam following the transabdominal exam to visualize the endometrium and ovaries. Color and duplex Doppler ultrasound was utilized to evaluate blood flow to the ovaries. COMPARISON:  None FINDINGS:  Uterus Measurements: 7.2 x 2.9 x 4.6 cm = volume: 50 mL. Anteverted. Anterior wall Caesarean section scar. At least partially septate morphology. No focal mass. Endometrium Thickness: 4 mm. Small amount of endometrial fluid, nonspecific. No focal mass. Right ovary Measurements: 2.5 x 1.6 x 1.9 cm = volume: 4 mL. Normal morphology without mass. Internal blood flow present on color Doppler imaging. Left ovary Measurements: 2.7 x 2.2 x 2.5 cm = volume: 7.7 mL. Normal morphology without mass. Internal blood flow present on color Doppler imaging. Pulsed Doppler evaluation of both ovaries demonstrates normal low-resistance arterial and venous waveforms. Other findings Trace free pelvic fluid.  No adnexal masses. IMPRESSION: No evidence of ovarian mass or torsion. Small amount of nonspecific endometrial fluid. Otherwise negative exam. Electronically Signed   By: Lavonia Dana M.D.   On: 02/21/2021 15:27      Ultrasound negative for acute torsion or ovarian mass. ____________________________________________   PROCEDURES  Procedure(s) performed: None  Procedures  Critical Care performed: No  ____________________________________________   INITIAL IMPRESSION / ASSESSMENT AND PLAN / ED COURSE  Pertinent labs & imaging results that were available during my care of the patient were reviewed by me and considered in my medical decision making (see chart for details).   Patient presents for about 2 months of left lower quadrant abdominal pain.  Overall very reassuring exam nontoxic well-appearing.  Very slight transaminitis with the patient does report a distant history of what is thought to be hepatitis a.  No right upper quadrant pain no signs or symptoms of suggest liver disease acutely.  Pain is located in the left lower quadrant without rebound or guarding and a reassuring exam without evidence of acute abdomen.  Discussed with the patient and recommended pelvic examination to further evaluate for infections, but patient declined this and has had recent testing through her PCP and treatment for vaginosis and yeast infection.  ----------------------------------------- 3:58 PM on 02/21/2021 ----------------------------------------- Patient resting comfortably feels improved after ibuprofen.  Discussed and recommended follow-up with [facility].  I did discuss with the patient and offered obtaining further work-up including abdominal CT to evaluate for issues with the colon kidneys gallbladder bladder etc., patient declined this reports that she does feel improved she wanted to get an ultrasound to make sure her left ovary looked normal from her visit today.  She would like to prefer to follow-up with her [facility], I think this is reasonable.  Low pretest probability for acute intra-abdominal catastrophe  Return precautions and treatment recommendations and follow-up discussed  with the patient who is agreeable with the plan.       ____________________________________________   FINAL CLINICAL IMPRESSION(S) / ED DIAGNOSES  Final diagnoses:  LLQ abdominal pain        Note:  This document was prepared using Dragon voice recognition software and may include unintentional dictation errors       Delman Kitten, MD 02/21/21 1559

## 2021-02-21 NOTE — ED Triage Notes (Signed)
Pt comes into the ED via POV c/o lower abdominal pain x 2 months around her ovaries.  Pt states most of the pain is on the left but sometimes it also hurts on the right.  Pt denies any N/V/D.  Pt denies any vaginal bleeding.  Pt in NAD.

## 2021-02-21 NOTE — Discharge Instructions (Addendum)
As we discussed, please follow-up closely with your doctor at [facility].  I do recommend that she have some further follow-up also on slight abnormality in your liver testing.  Consider having hepatitis virus screening with your primary care physician.  Please return to the emergency room right away if you are to develop a fever, severe nausea, your pain becomes severe or worsens, you are unable to keep food down, begin vomiting any dark or bloody fluid, you develop any dark or bloody stools, feel dehydrated, or other new concerns or symptoms arise.

## 2021-02-22 LAB — URINE CULTURE

## 2021-11-22 ENCOUNTER — Ambulatory Visit: Payer: Self-pay | Admitting: Internal Medicine

## 2022-12-26 ENCOUNTER — Ambulatory Visit
Admission: EM | Admit: 2022-12-26 | Discharge: 2022-12-26 | Disposition: A | Discharge status: Home / Self Care | Payer: Medicaid Other

## 2022-12-26 DIAGNOSIS — B349 Viral infection, unspecified: Secondary | ICD-10-CM | POA: Diagnosis not present

## 2022-12-26 MED ORDER — PSEUDOEPH-BROMPHEN-DM 30-2-10 MG/5ML PO SYRP: 5.0000 mL | ORAL_SOLUTION | Freq: Four times a day (QID) | ORAL | 0 refills | Status: AC | PRN

## 2022-12-26 NOTE — ED Triage Notes (Signed)
Patient to Urgent Care with complaints of URI symptoms that started three days ago. Reports feeling worse today. Generalized body aches/ chills/ nasal drainage/ itchy throat. Today started developing watery eyes.  Has been taking theraflu.

## 2022-12-26 NOTE — Discharge Instructions (Addendum)
Take the Bromphed DM as directed.  Follow up with your primary care provider if your symptoms are not improving.    

## 2022-12-26 NOTE — ED Provider Notes (Signed)
Renaldo Fiddler    CSN: [id number] Arrival date & time: 12/26/22  1740      History   Chief Complaint Chief Complaint  Patient presents with   Nasal Congestion    HPI Shelby Hale is a 32 y.o. female.  Patient presents with 3-day history of bodyaches, chills, congestion, runny nose, cough.  Treating symptoms with TheraFlu; last taken at 0800 this morning.  She denies fever, rash, shortness of breath, vomiting, diarrhea, or other symptoms.  The history is provided by the patient and medical records.    Past Medical History:  Diagnosis Date   Migraines     There are no problems to display for this patient.   History reviewed. No pertinent surgical history.  OB History     Gravida  1   Para      Term      Preterm      AB      Living  0      SAB      IAB      Ectopic      Multiple      Live Births               Home Medications    Prior to Admission medications   Medication Sig Start Date End Date Taking? Authorizing Provider  brompheniramine-pseudoephedrine-DM 30-2-10 MG/5ML syrup Take 5 mLs by mouth 4 (four) times daily as needed. 12/26/22  Yes Mickie Bail, NP  [facility] [employer] 150-35 MCG/24HR transdermal patch 1 patch once a week. 12/17/22  Yes [provider]  EPINEPHrine (EPIPEN 2-PAK) 0.3 mg/0.3 mL IJ SOAJ injection Inject 0.3 mg into the muscle as needed for anaphylaxis. 08/18/20   Willy Eddy, MD  ibuprofen (ADVIL,MOTRIN) 800 MG tablet Take 1 tablet (800 mg total) by mouth every 8 (eight) hours as needed. Patient not taking: Reported on 11/27/2017 05/27/15   Jennye Moccasin, MD  metoCLOPramide (REGLAN) 10 MG tablet Take 1 tablet (10 mg total) by mouth every 8 (eight) hours as needed for nausea. Patient not taking: Reported on 12/26/2022 05/27/15 05/26/16  Jennye Moccasin, MD  mupirocin ointment Idelle Jo) 2 % Apply to skin qd-bid Patient not taking: Reported on 12/26/2022 11/30/20   Deirdre Evener, MD  Prenatal Vit-Fe  Fumarate-FA (PRENATAL MULTIVITAMIN) TABS tablet Take 1 tablet by mouth daily at 12 noon. Patient not taking: Reported on 10/11/2020    [provider]    Family History History reviewed. No pertinent family history.  Social History Social History   Tobacco Use   Smoking status: Never   Smokeless tobacco: Never  Substance Use Topics   Alcohol use: No   Drug use: No     Allergies   Amoxicillin   Review of Systems Review of Systems  Constitutional:  Positive for chills. Negative for fever.  HENT:  Positive for congestion, postnasal drip and rhinorrhea. Negative for ear pain and sore throat.   Respiratory:  Positive for cough. Negative for shortness of breath.   Cardiovascular:  Negative for chest pain and palpitations.  Gastrointestinal:  Negative for diarrhea and vomiting.  Skin:  Negative for rash.     Physical Exam Triage Vital Signs ED Triage Vitals  Enc Vitals Group     BP 12/26/22 1808 117/82     Pulse Rate 12/26/22 1801 89     Resp 12/26/22 1801 18     Temp 12/26/22 1801 98.7 F (37.1 C)     Temp src --  SpO2 12/26/22 1801 98 %     Weight --      Height --      Head Circumference --      Peak Flow --      Pain Score 12/26/22 1802 8     Pain Loc --      Pain Edu? --      Excl. in [place]? --    No data found.  Updated Vital Signs BP 117/82   Pulse 89   Temp 98.7 F (37.1 C)   Resp 18   LMP 12/01/2022   SpO2 98%   Visual Acuity Right Eye Distance:   Left Eye Distance:   Bilateral Distance:    Right Eye Near:   Left Eye Near:    Bilateral Near:     Physical Exam Vitals and nursing note reviewed.  Constitutional:      General: She is not in acute distress.    Appearance: She is well-developed.  HENT:     Right Ear: Tympanic membrane normal.     Left Ear: Tympanic membrane normal.     Nose: Congestion and rhinorrhea present.     Mouth/Throat:     Mouth: Mucous membranes are moist.     Pharynx: Oropharynx is clear.   Cardiovascular:     Rate and Rhythm: Normal rate and regular rhythm.     Heart sounds: Normal heart sounds.  Pulmonary:     Effort: Pulmonary effort is normal. No respiratory distress.     Breath sounds: Normal breath sounds.  Musculoskeletal:     Cervical back: Neck supple.  Skin:    General: Skin is warm and dry.  Neurological:     Mental Status: She is alert.  Psychiatric:        Mood and Affect: Mood normal.        Behavior: Behavior normal.      UC Treatments / Results  Labs (all labs ordered are listed, but only abnormal results are displayed) Labs Reviewed - No data to display  EKG   Radiology No results found.  Procedures Procedures (including critical care time)  Medications Ordered in UC Medications - No data to display  Initial Impression / Assessment and Plan / UC Course  I have reviewed the triage vital signs and the nursing notes.  Pertinent labs & imaging results that were available during my care of the patient were reviewed by me and considered in my medical decision making (see chart for details).    Viral illness. Patient declines COVID test.  Treating with Bromfed-DM.  Tylenol as needed.  Education provided on viral illness.  Instructed patient to follow up with her PCP if symptoms are not improving.  She agrees to plan of care.   Final Clinical Impressions(s) / UC Diagnoses   Final diagnoses:  Viral illness     Discharge Instructions      Take the Bromphed DM as directed.  Follow up with your primary care provider if your symptoms are not improving.        ED Prescriptions     Medication Sig Dispense Auth. Provider   brompheniramine-pseudoephedrine-DM 30-2-10 MG/5ML syrup Take 5 mLs by mouth 4 (four) times daily as needed. 120 mL Mickie Bail, NP      PDMP not reviewed this encounter.   Mickie Bail, NP 12/26/22 [phone]

## 2023-01-12 IMAGING — US US PELVIS COMPLETE TRANSABD/TRANSVAG W DUPLEX
1 series · 13 of 25 positions shown · non-contrast
Comparison: None

CLINICAL DATA: LEFT lower quadrant pain for 2 months, dyspareunia,
history Caesarean section x2. LMP 02/13/2021

EXAM:
TRANSABDOMINAL AND TRANSVAGINAL ULTRASOUND OF PELVIS
DOPPLER ULTRASOUND OF OVARIES
TECHNIQUE: Both transabdominal and transvaginal ultrasound examinations of the
pelvis were performed. Transabdominal technique was performed for
global imaging of the pelvis including uterus, ovaries, adnexal
regions, and pelvic cul-de-sac.
It was necessary to proceed with endovaginal exam following the
transabdominal exam to visualize the endometrium and ovaries. Color
and duplex Doppler ultrasound was utilized to evaluate blood flow to
the ovaries.

[Series 1: us pelvic complete w transvaginal and torsion righ · 13 of 139 slices shown]
[im 1/139]
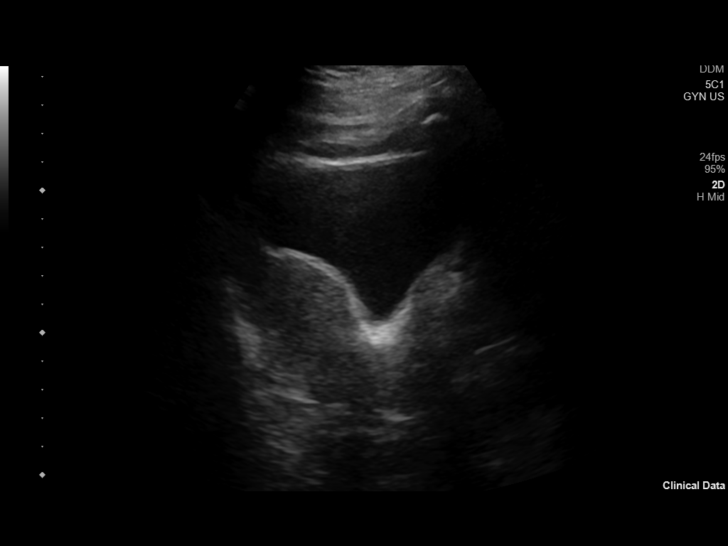
[im 12/139]
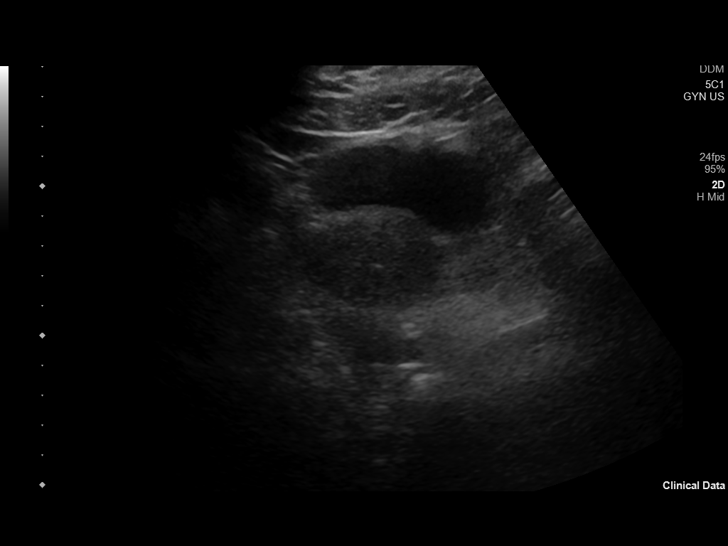
[im 24/139]
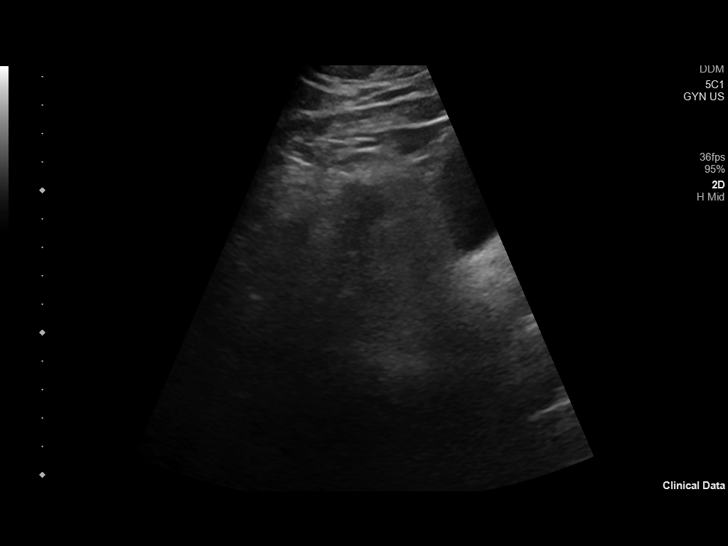
[im 35/139]
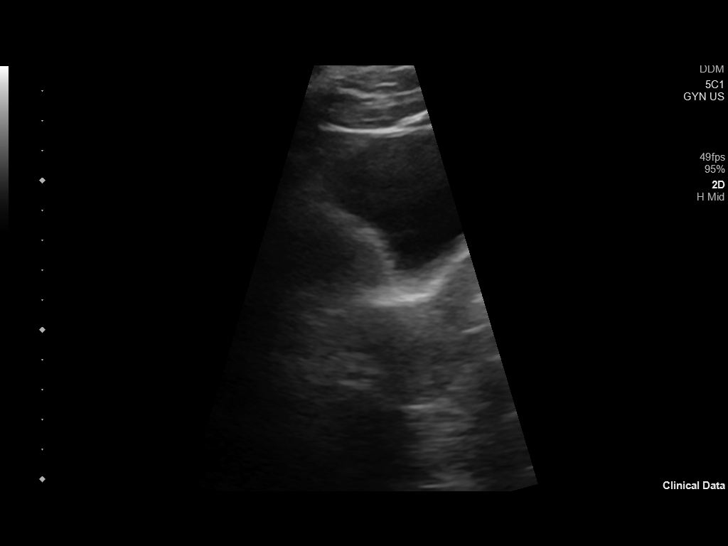
[im 47/139]
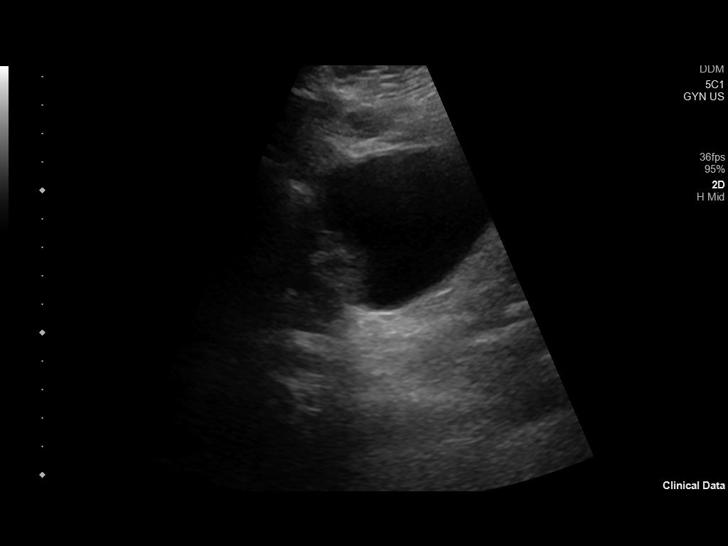
[im 58/139]
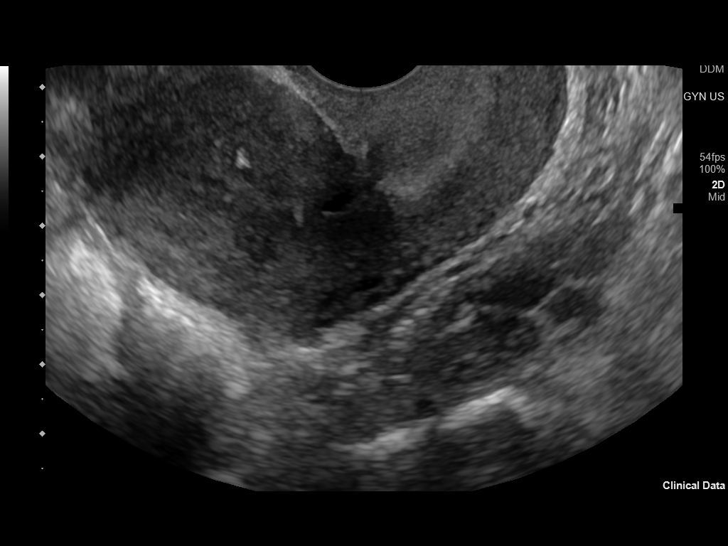
[im 70/139]
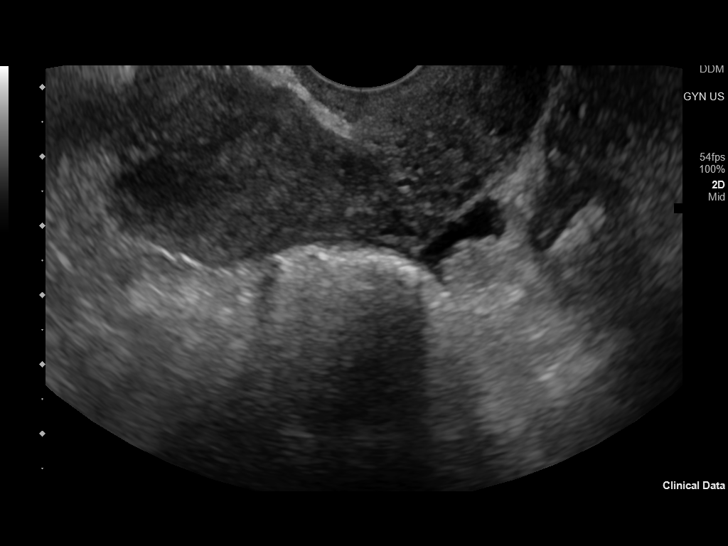
[im 81/139]
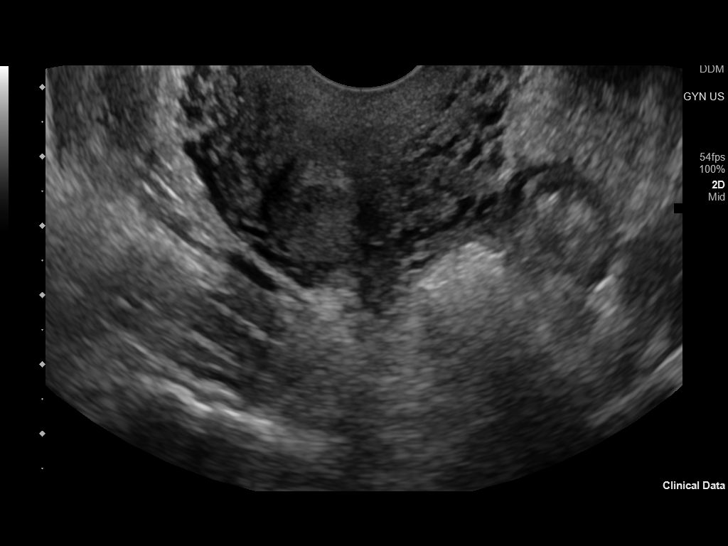
[im 93/139]
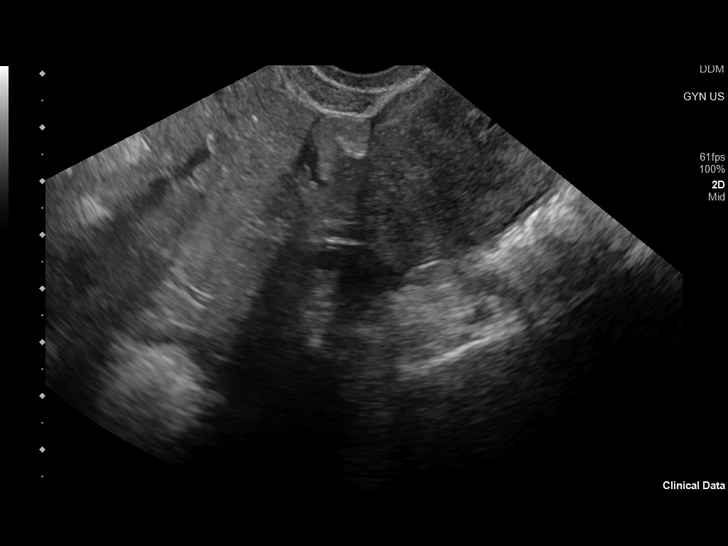
[im 104/139]
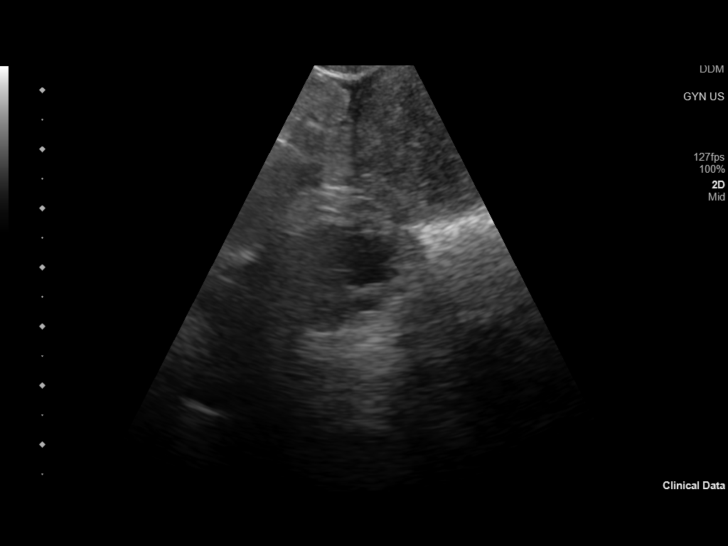
[im 116/139]
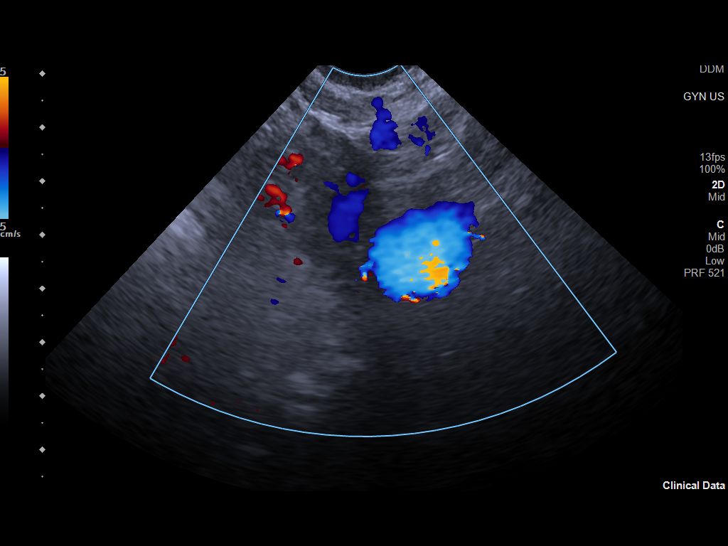
[im 127/139]
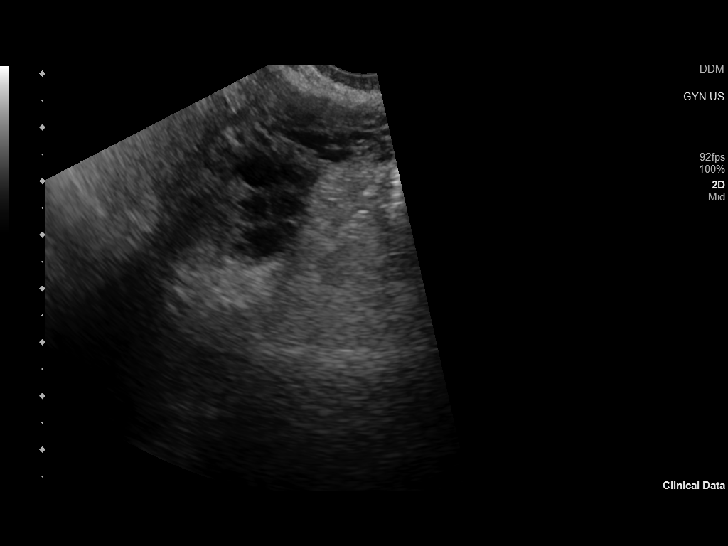
[im 139/139]
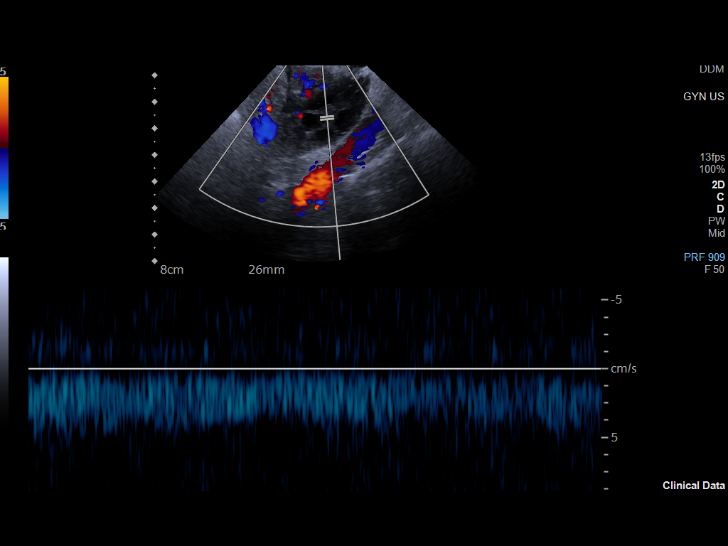

[13 of 25 positions shown; findings below may reference images not displayed]

FINDINGS: Uterus

Measurements: 7.2 x 2.9 x 4.6 cm = volume: 50 mL. Anteverted.
Anterior wall Caesarean section scar. At least partially septate
morphology. No focal mass.

Endometrium

Thickness: 4 mm. Small amount of endometrial fluid, nonspecific. No
focal mass.

Right ovary

Measurements: 2.5 x 1.6 x 1.9 cm = volume: 4 mL. Normal morphology
without mass. Internal blood flow present on color Doppler imaging.

Left ovary

Measurements: 2.7 x 2.2 x 2.5 cm = volume: 7.7 mL. Normal morphology
without mass. Internal blood flow present on color Doppler imaging.

Pulsed Doppler evaluation of both ovaries demonstrates normal
low-resistance arterial and venous waveforms.

Other findings

Trace free pelvic fluid.  No adnexal masses.
IMPRESSION: No evidence of ovarian mass or torsion.

Small amount of nonspecific endometrial fluid.

Otherwise negative exam.
